# Patient Record
Sex: Male | Born: 1980 | Race: Asian | Hispanic: No | Marital: Single | Smoking: Current every day smoker
Health system: Southern US, Community
[De-identification: ages and names within clinical notes are randomized; demographics above are authoritative.]

---

## 2019-12-16 DIAGNOSIS — Z20822 Contact with and (suspected) exposure to covid-19: Secondary | ICD-10-CM | POA: Insufficient documentation

## 2019-12-16 DIAGNOSIS — B349 Viral infection, unspecified: Secondary | ICD-10-CM | POA: Insufficient documentation

## 2019-12-16 DIAGNOSIS — F17210 Nicotine dependence, cigarettes, uncomplicated: Secondary | ICD-10-CM | POA: Insufficient documentation

## 2019-12-16 NOTE — ED Triage Notes (Signed)
Per ems vitals 128/72, hr 76, 98%, resp 18

## 2019-12-16 NOTE — ED Triage Notes (Signed)
First rn note: per ems pt coming from work, pt became hot, began to tingling over whole body. Per ems pt states has been going on intermittently for a week. Primary language per ems burmese.

## 2019-12-17 ENCOUNTER — Other Ambulatory Visit: Payer: Self-pay

## 2019-12-17 ENCOUNTER — Emergency Department: Payer: Self-pay

## 2019-12-17 ENCOUNTER — Encounter: Payer: Self-pay | Admitting: Emergency Medicine

## 2019-12-17 ENCOUNTER — Emergency Department
Admission: EM | Admit: 2019-12-17 | Discharge: 2019-12-17 | Disposition: A | Discharge status: Home / Self Care | Payer: Self-pay | Attending: Emergency Medicine | Admitting: Emergency Medicine

## 2019-12-17 DIAGNOSIS — B349 Viral infection, unspecified: Secondary | ICD-10-CM

## 2019-12-17 LAB — BASIC METABOLIC PANEL
Anion gap: 9 (ref 5–15)
BUN: 17 mg/dL (ref 6–20)
CO2: 27 mmol/L (ref 22–32)
Calcium: 9.3 mg/dL (ref 8.9–10.3)
Chloride: 101 mmol/L (ref 98–111)
Creatinine, Ser: 0.84 mg/dL (ref 0.61–1.24)
GFR calc Af Amer: >60 mL/min (ref >60)
GFR calc non Af Amer: >60 mL/min (ref >60)
Glucose, Bld: 101 mg/dL — ABNORMAL HIGH (ref 70–99)
Potassium: 3.9 mmol/L (ref 3.5–5.1)
Sodium: 137 mmol/L (ref 135–145)

## 2019-12-17 LAB — CBC WITH DIFFERENTIAL/PLATELET
Abs Immature Granulocytes: 0.02 10*3/uL (ref 0.00–0.07)
Basophils Absolute: 0.1 10*3/uL (ref 0.0–0.1)
Basophils Relative: 1 %
Eosinophils Absolute: 0.2 10*3/uL (ref 0.0–0.5)
Eosinophils Relative: 2 %
HCT: 44 % (ref 39.0–52.0)
Hemoglobin: 15.3 g/dL (ref 13.0–17.0)
Immature Granulocytes: 0 %
Lymphocytes Relative: 20 %
Lymphs Abs: 1.8 10*3/uL (ref 0.7–4.0)
MCH: 30.2 pg (ref 26.0–34.0)
MCHC: 34.8 g/dL (ref 30.0–36.0)
MCV: 87 fL (ref 80.0–100.0)
Monocytes Absolute: 0.7 10*3/uL (ref 0.1–1.0)
Monocytes Relative: 7 %
Neutro Abs: 6.5 10*3/uL (ref 1.7–7.7)
Neutrophils Relative %: 70 %
Platelets: 260 10*3/uL (ref 150–400)
RBC: 5.06 MIL/uL (ref 4.22–5.81)
RDW: 11.9 % (ref 11.5–15.5)
WBC: 9.3 10*3/uL (ref 4.0–10.5)
nRBC: 0 % (ref 0.0–0.2)

## 2019-12-17 LAB — SARS CORONAVIRUS 2 (TAT 6-24 HRS): SARS Coronavirus 2: NEGATIVE

## 2019-12-17 NOTE — Discharge Instructions (Signed)
As we discussed, we believe your symptoms are caused by a respiratory virus.  However, because we cannot rule out the possibility of COVID-19 at this time, we sent a nasal swab test from the Emergency Department (ED).  Please read through the included information in this paperwork for information about how to set up a MyChart account so that you can log in over the next couple of days for your test results (including COVID-19 swab results if one was obtained during your Emergency Department visit).  Read through all the included information including the recommendations from the CDC.  If your test is positive, we recommend that you self-quarantine at home for 10-14 days after your fever has gone away (without taking medication to make your temperature come down, such as Tylenol (acetaminophen)), after your respiratory symptoms have improved, and after at least 14 days have passed since your symptoms first appeared.  You should have as minimal contact as possible with anyone else including close family as per the CDC paperwork guidelines listed below. Follow-up with your doctor by phone or online as needed and return immediately to the emergency department or call 911 only if you develop new or worsening symptoms that concern you.  You can find up-to-date information about COVID-19 in [place] by calling the [place] Coronavirus Helpline: 1-866-462-3821. You may also call 2-1-1, or 888-892-1162, or additional resources.  You can also find information online at https://www.ncdhhs.gov/divisions/public-health/coronavirus-disease-2019-covid-19-response-[place], or on the Center for Disease Control (CDC) website at https://www.cdc.gov/coronavirus/2019-ncov/index.html.  

## 2019-12-17 NOTE — ED Provider Notes (Signed)
[facility] Emergency Department Provider Note  ____________________________________________   First MD Initiated Contact with Patient 12/17/19 0255     (approximate)  I have reviewed the triage vital signs and the nursing notes.   HISTORY  Chief Complaint Shortness of Breath  History is limited by the patient speaking only Burmese.  iPad audio interpreting services were utilized.   HPI [facility] is a 39 y.o. male he denies chronic medical issues.  He presents tonight for weeks or possibly months of intermittent issues with cough, difficulty breathing, general malaise, and occasional sore throat.  He said that he was tested for COVID-19 about 2 months ago and it was negative but the symptoms persist.   He initially said that he has a primary care doctor that he has been seeing about this but then he said through the interpreter that he does not have a doctor.  Nothing in particular makes his symptoms better or worse.  Tonight at work he started to feel hot and with tingling all over his body so he came in by EMS but the symptoms have resolved.  Denies chest and abdominal pain.  He is eating and drinking normally.  He currently does not feel short of breath.        History reviewed. No pertinent past medical history.  There are no problems to display for this patient.   History reviewed. No pertinent surgical history.  Prior to Admission medications   Not on File    Allergies Patient has no known allergies.  No family history on file.  Social History Social History   Tobacco Use  . Smoking status: Current Every Day Smoker    Packs/day: 0.10  . Smokeless tobacco: Never Used  Substance Use Topics  . Alcohol use: Yes  . Drug use: Never    Review of Systems History is limited by the patient speaking only Burmese.  iPad audio interpreting services were utilized.  Constitutional: Intermittent fever/chills for weeks. Eyes: No visual  changes. ENT: Occasional sore throat. Cardiovascular: Denies chest pain. Respiratory: Occasional shortness of breath. Gastrointestinal: No abdominal pain.  No nausea, no vomiting.  No diarrhea.  No constipation. Genitourinary: Negative for dysuria. Musculoskeletal: Negative for neck pain.  Negative for back pain. Integumentary: Negative for rash. Neurological: Negative for headaches, focal weakness or numbness.   ____________________________________________   PHYSICAL EXAM:  VITAL SIGNS: ED Triage Vitals  Enc Vitals Group     BP 12/17/19 0010 129/79     Pulse Rate 12/17/19 0010 92     Resp 12/17/19 0010 18     Temp 12/17/19 0018 98.2 F (36.8 C)     Temp Source 12/17/19 0018 Oral     SpO2 12/17/19 0010 100 %     Weight 12/17/19 0011 72.6 kg (160 lb)     Height 12/17/19 0011 1.727 m (5\' 8" )     Head Circumference --      Peak Flow --      Pain Score 12/17/19 0010 5     Pain Loc --      Pain Edu? --      Excl. in GC? --     Constitutional: Alert and oriented.  Well-appearing and in no distress. Eyes: Conjunctivae are normal.  Head: Atraumatic. Nose: No congestion/rhinnorhea. Mouth/Throat: Patient is wearing a mask. Neck: No stridor.  No meningeal signs.   Cardiovascular: Normal rate, regular rhythm. Good peripheral circulation. Grossly normal heart sounds. Respiratory: Normal respiratory effort.  No  retractions. Gastrointestinal: Soft and nontender. No distention.  Musculoskeletal: No lower extremity tenderness nor edema. No gross deformities of extremities. Neurologic:  Normal speech and language. No gross focal neurologic deficits are appreciated.  Skin:  Skin is warm, dry and intact.  ____________________________________________   LABS (all labs ordered are listed, but only abnormal results are displayed)  Labs Reviewed  BASIC METABOLIC PANEL - Abnormal; Notable for the following components:      Result Value   Glucose, Bld 101 (*)    All other components  within normal limits  SARS CORONAVIRUS 2 (TAT 6-24 HRS)  CBC WITH DIFFERENTIAL/PLATELET   ____________________________________________  EKG  ED ECG REPORT I, Hinda Kehr, the attending physician, personally viewed and interpreted this ECG.  Date: 12/17/2019 EKG Time: 00:21 Rate: 79 Rhythm: normal sinus rhythm QRS Axis: normal Intervals: normal ST/T Wave abnormalities: normal Narrative Interpretation: no evidence of acute ischemia  ____________________________________________  RADIOLOGY I, Hinda Kehr, personally viewed and evaluated these images (plain radiographs) as part of my medical decision making, as well as reviewing the written report by the radiologist.  ED MD interpretation: No acute abnormalities on chest x-ray.  Official radiology report(s): DG Chest 2 View  Result Date: 12/17/2019 CLINICAL DATA:  Shortness of breath EXAM: CHEST - 2 VIEW COMPARISON:  None. FINDINGS: Lungs are clear.  No pleural effusion or pneumothorax. The heart is normal in size. Visualized osseous structures are within normal limits. IMPRESSION: Normal chest radiographs. Electronically Signed   By: Julian Hy M.D.   On: 12/17/2019 01:04    ____________________________________________   PROCEDURES   Procedure(s) performed (including Critical Care):  Procedures   ____________________________________________   INITIAL IMPRESSION / MDM / Jeff / ED COURSE  As part of my medical decision making, I reviewed the following data within the [place] notes reviewed and incorporated, Interpreter needed, Labs reviewed , Old chart reviewed, Radiograph reviewed  and Notes from prior ED visits   Differential diagnosis includes, but is not limited to, COVID-19, community-acquired bacterial pneumonia, other nonspecific viral illness.  The patient's vital signs are within normal limits and lab work is also within normal limits.  Chest x-ray is normal  and EKG is nonischemic.  PERC negative.  I explained that his most likely diagnosis is COVID-19 and that we would send another swab.  He knows to check the computer for the results in 1 to 2 days.  I gave my usual and customary possible COVID-19 recommendations and return precautions via the interpreter and he says that he understands and has no questions for me at this time.          ____________________________________________  FINAL CLINICAL IMPRESSION(S) / ED DIAGNOSES  Final diagnoses:  Viral illness     MEDICATIONS GIVEN DURING THIS VISIT:  Medications - No data to display   ED Discharge Orders    None      *Please note:  [facility] was evaluated in Emergency Department on 12/17/2019 for the symptoms described in the history of present illness. He was evaluated in the context of the global COVID-19 pandemic, which necessitated consideration that the patient might be at risk for infection with the SARS-CoV-2 virus that causes COVID-19. Institutional protocols and algorithms that pertain to the evaluation of patients at risk for COVID-19 are in a state of rapid change based on information released by regulatory bodies including the CDC and federal and state organizations. These policies and algorithms were followed during the patient's  care in the ED.  Some ED evaluations and interventions may be delayed as a result of limited staffing during the pandemic.*  Note:  This document was prepared using Dragon voice recognition software and may include unintentional dictation errors.   Loleta Rose, MD 12/17/19 [phone]

## 2019-12-17 NOTE — ED Notes (Signed)
Pt reports last COVID test 29 Jan, 2021 -- NEGATIVE

## 2019-12-17 NOTE — ED Triage Notes (Signed)
Burmese audio interpreter used for triage. Pt reports that he has difficulty breathing and is sick x 2-3 weeks. Pt denies seeing a PCP for these concerns. Pt has clear lung sounds at this time with no labored breathing. Pt able to speak in complete sentences without difficulty.

## 2020-11-24 IMAGING — CR DG CHEST 2V
2 series · 2 of 2 positions shown · non-contrast
Comparison: None.

CLINICAL DATA: Shortness of breath

EXAM:
CHEST - 2 VIEW

[chest pa]
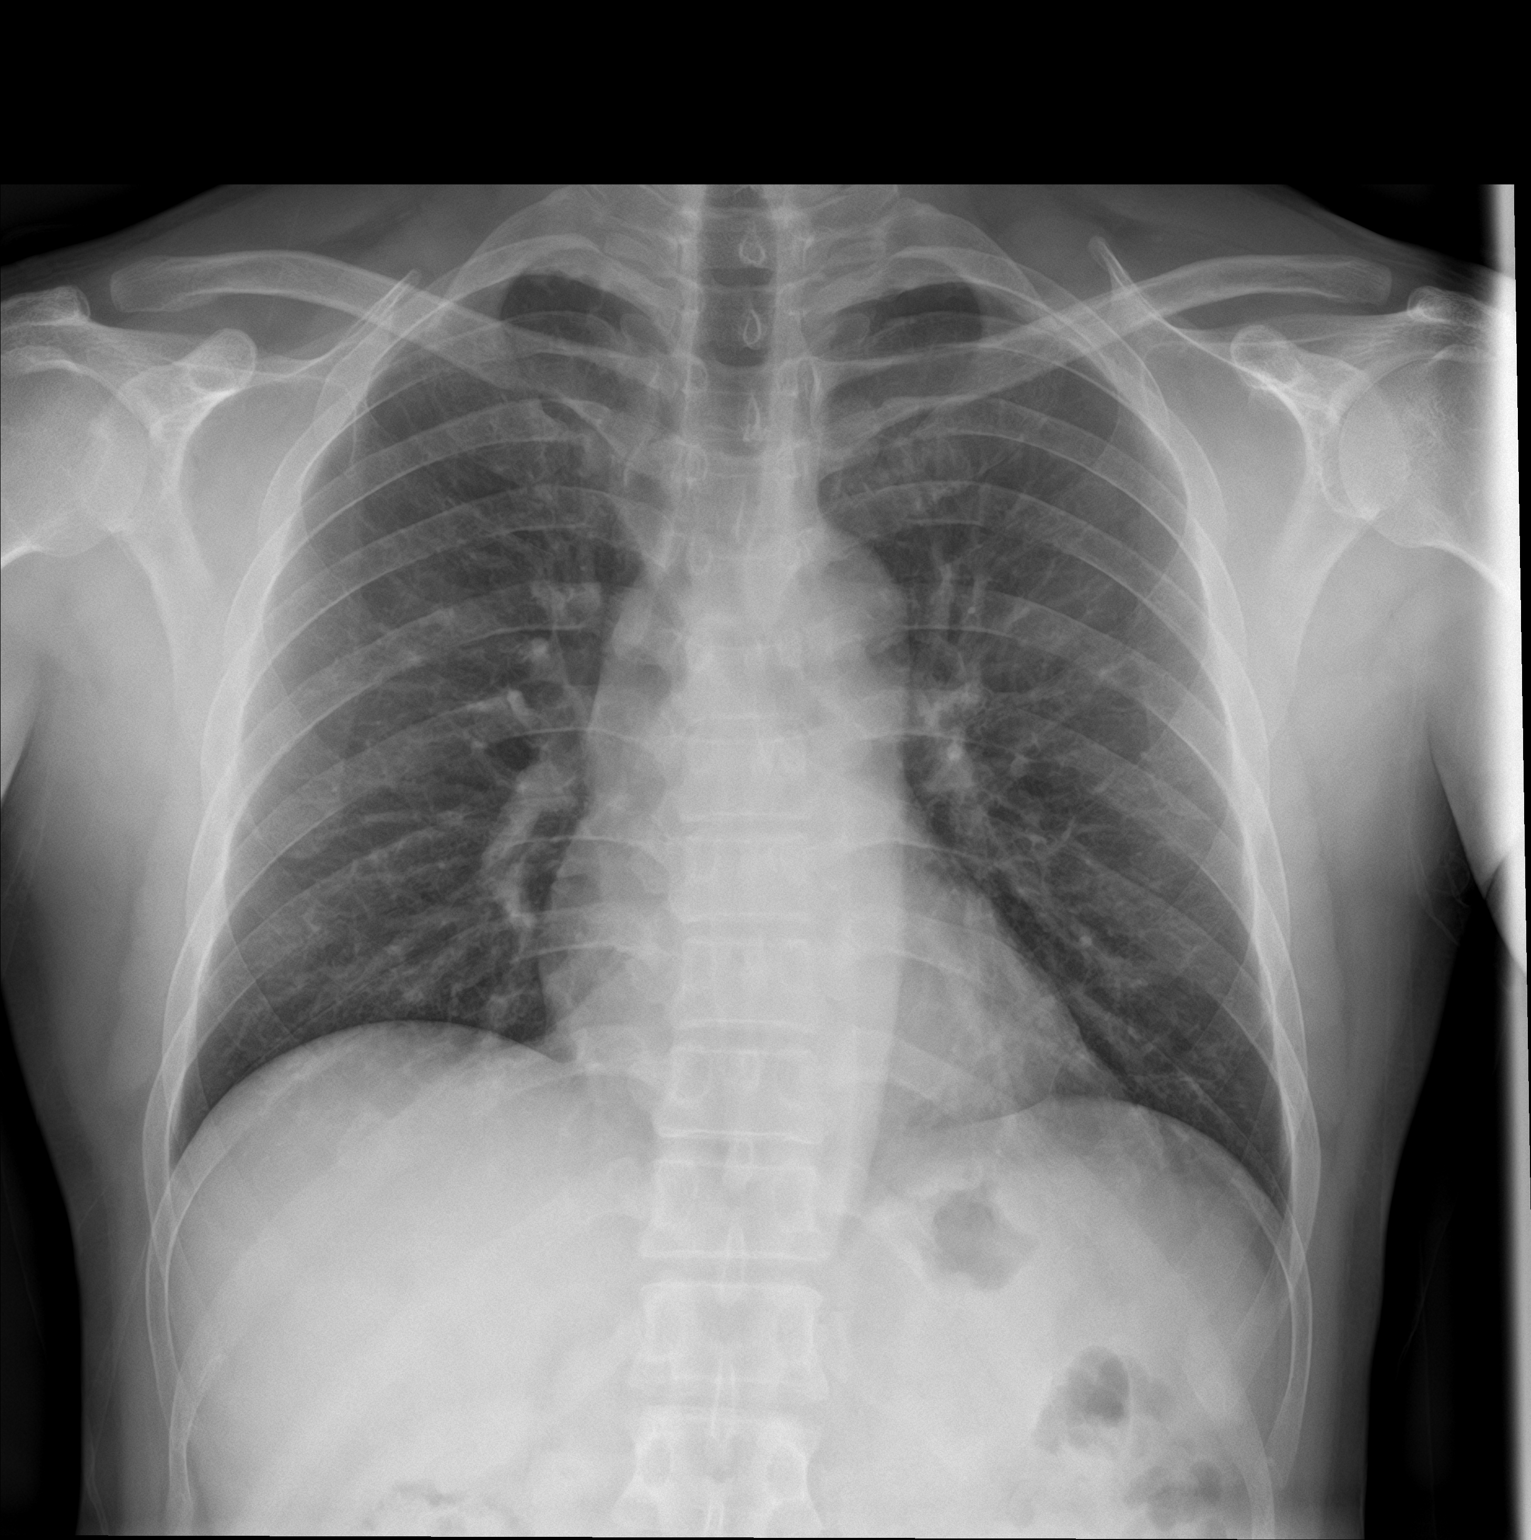

[chest lat]
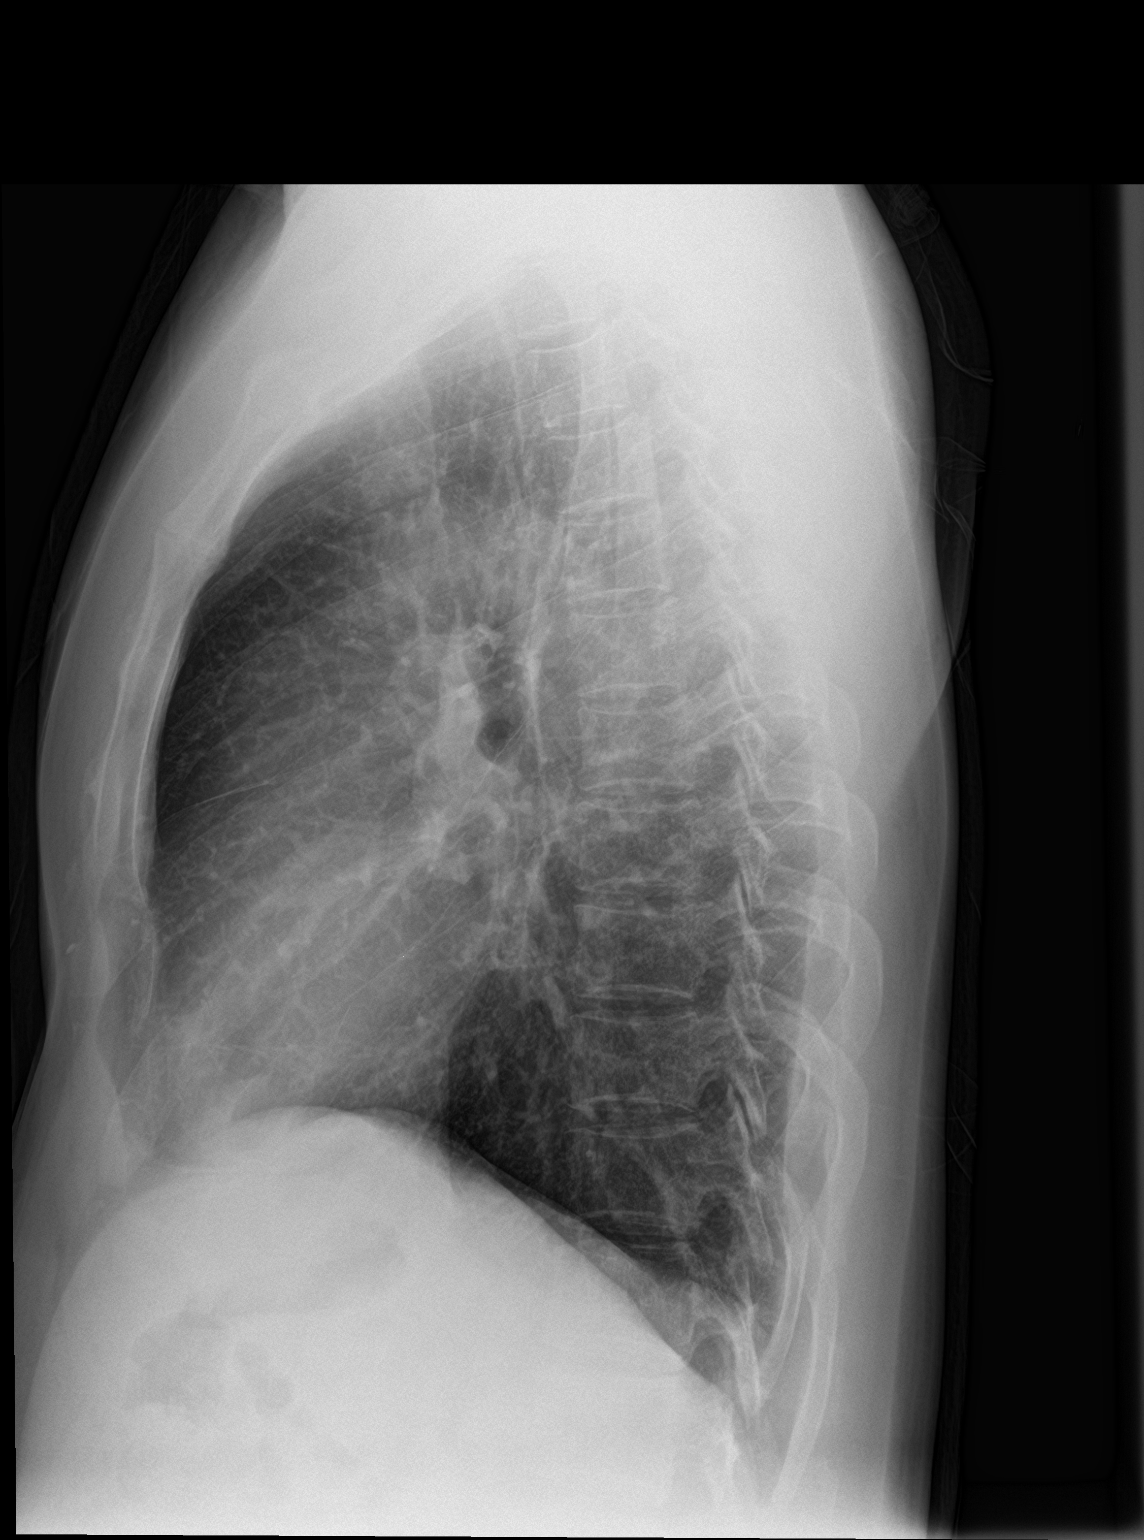

[2 of 2 positions shown; findings below may reference images not displayed]

FINDINGS: Lungs are clear.  No pleural effusion or pneumothorax.

The heart is normal in size.

Visualized osseous structures are within normal limits.
IMPRESSION: Normal chest radiographs.
